# Patient Record
Sex: Male | Born: 1975 | Race: White | Hispanic: No | Marital: Married | Smoking: Never smoker
Health system: Southern US, Community
[De-identification: ages and names within clinical notes are randomized; demographics above are authoritative.]

## PROBLEM LIST (undated history)

## (undated) DIAGNOSIS — K4090 Unilateral inguinal hernia, without obstruction or gangrene, not specified as recurrent: Secondary | ICD-10-CM

## (undated) HISTORY — DX: Unilateral inguinal hernia, without obstruction or gangrene, not specified as recurrent: K40.90

---
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
## 1997-08-06 ENCOUNTER — Encounter: Payer: Self-pay | Admitting: Internal Medicine

## 2003-05-26 HISTORY — PX: INGUINAL HERNIA REPAIR: SHX194

## 2004-02-28 ENCOUNTER — Encounter: Payer: Self-pay | Admitting: Internal Medicine

## 2004-05-20 ENCOUNTER — Ambulatory Visit: Payer: Self-pay | Admitting: Family Medicine

## 2005-04-07 ENCOUNTER — Ambulatory Visit: Payer: Self-pay | Admitting: Internal Medicine

## 2006-08-10 ENCOUNTER — Ambulatory Visit: Payer: Self-pay | Admitting: Internal Medicine

## 2007-09-20 ENCOUNTER — Ambulatory Visit: Payer: Self-pay | Admitting: Internal Medicine

## 2007-09-20 DIAGNOSIS — M949 Disorder of cartilage, unspecified: Secondary | ICD-10-CM

## 2007-09-20 DIAGNOSIS — M899 Disorder of bone, unspecified: Secondary | ICD-10-CM | POA: Insufficient documentation

## 2008-06-27 ENCOUNTER — Ambulatory Visit: Payer: Self-pay | Admitting: Internal Medicine

## 2008-06-27 DIAGNOSIS — J069 Acute upper respiratory infection, unspecified: Secondary | ICD-10-CM | POA: Insufficient documentation

## 2008-07-02 ENCOUNTER — Telehealth: Payer: Self-pay | Admitting: Internal Medicine

## 2008-07-03 ENCOUNTER — Telehealth: Payer: Self-pay | Admitting: Internal Medicine

## 2008-07-03 ENCOUNTER — Ambulatory Visit: Payer: Self-pay | Admitting: Internal Medicine

## 2010-04-02 IMAGING — CR DG CHEST 2V
3 series · 3 of 3 positions shown · non-contrast
Comparison: None

CLINICAL DATA: URI.  Congestion and cough.

CHEST - 2 VIEW

[view not recorded (1 of 3)]
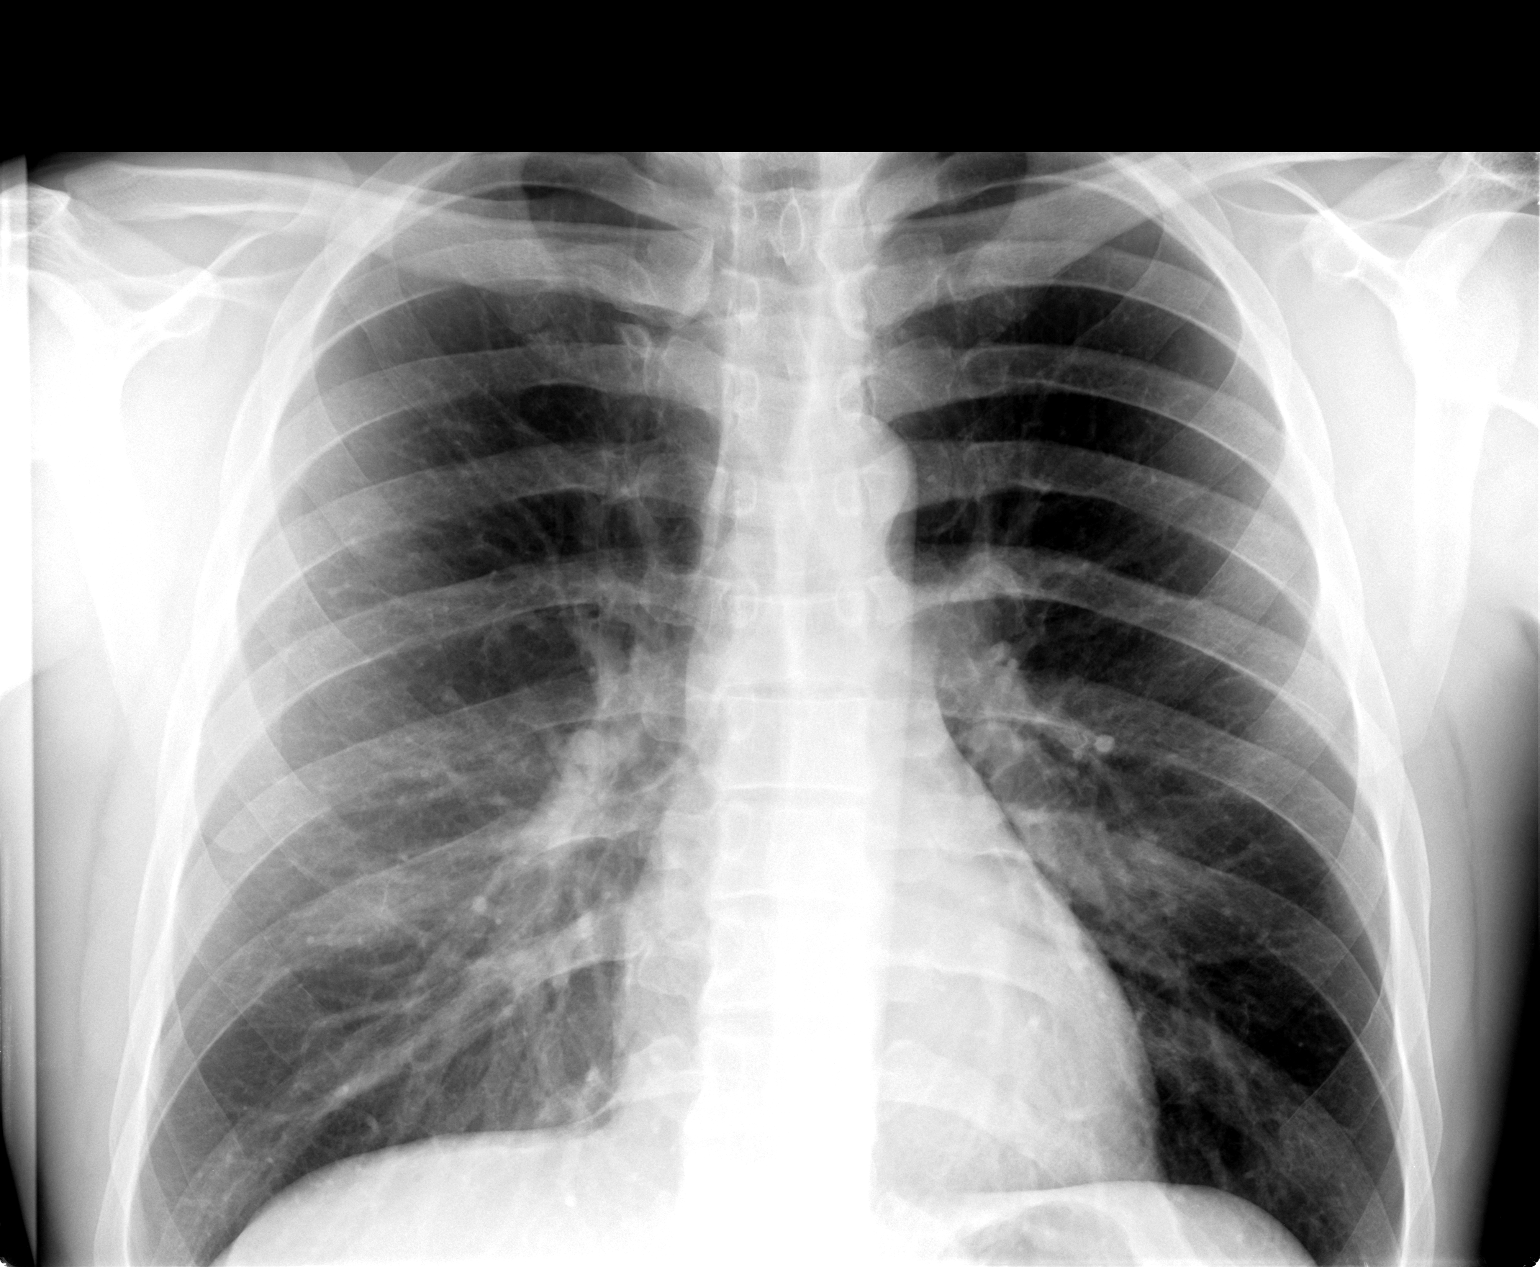

[view not recorded (2 of 3)]
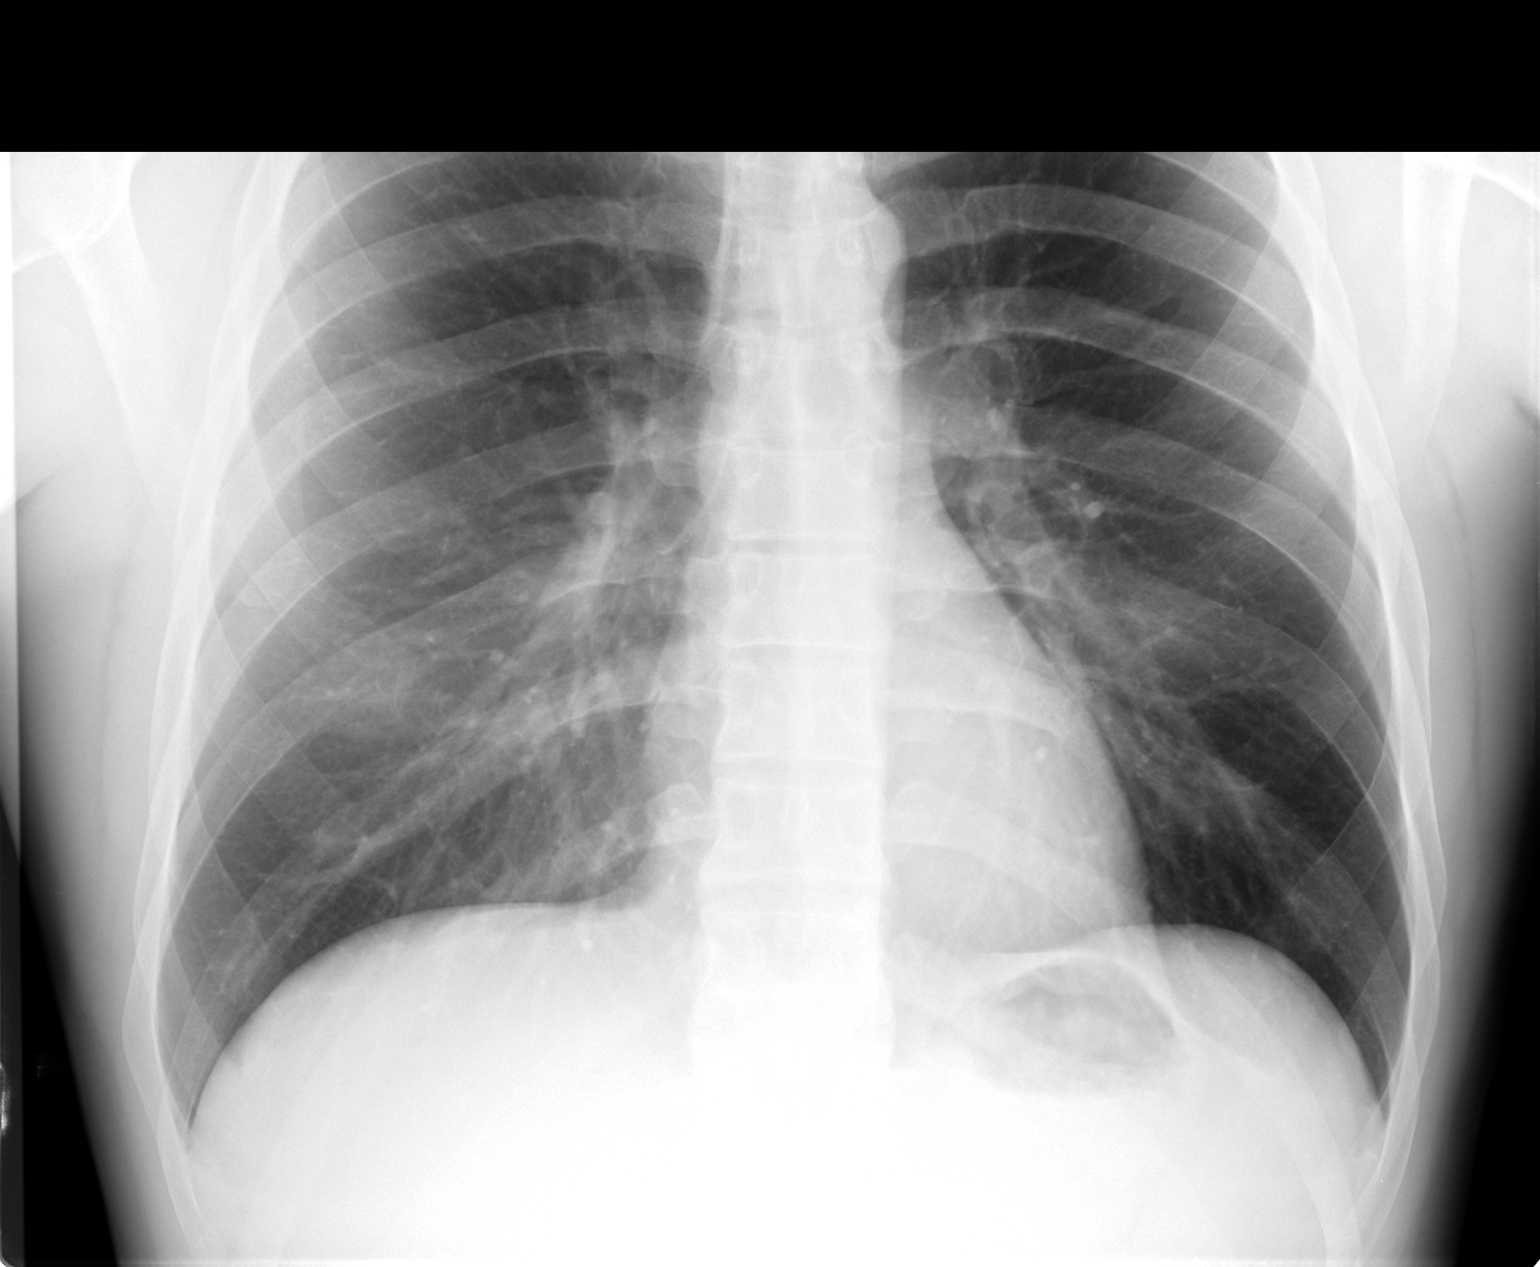

[view not recorded (3 of 3)]
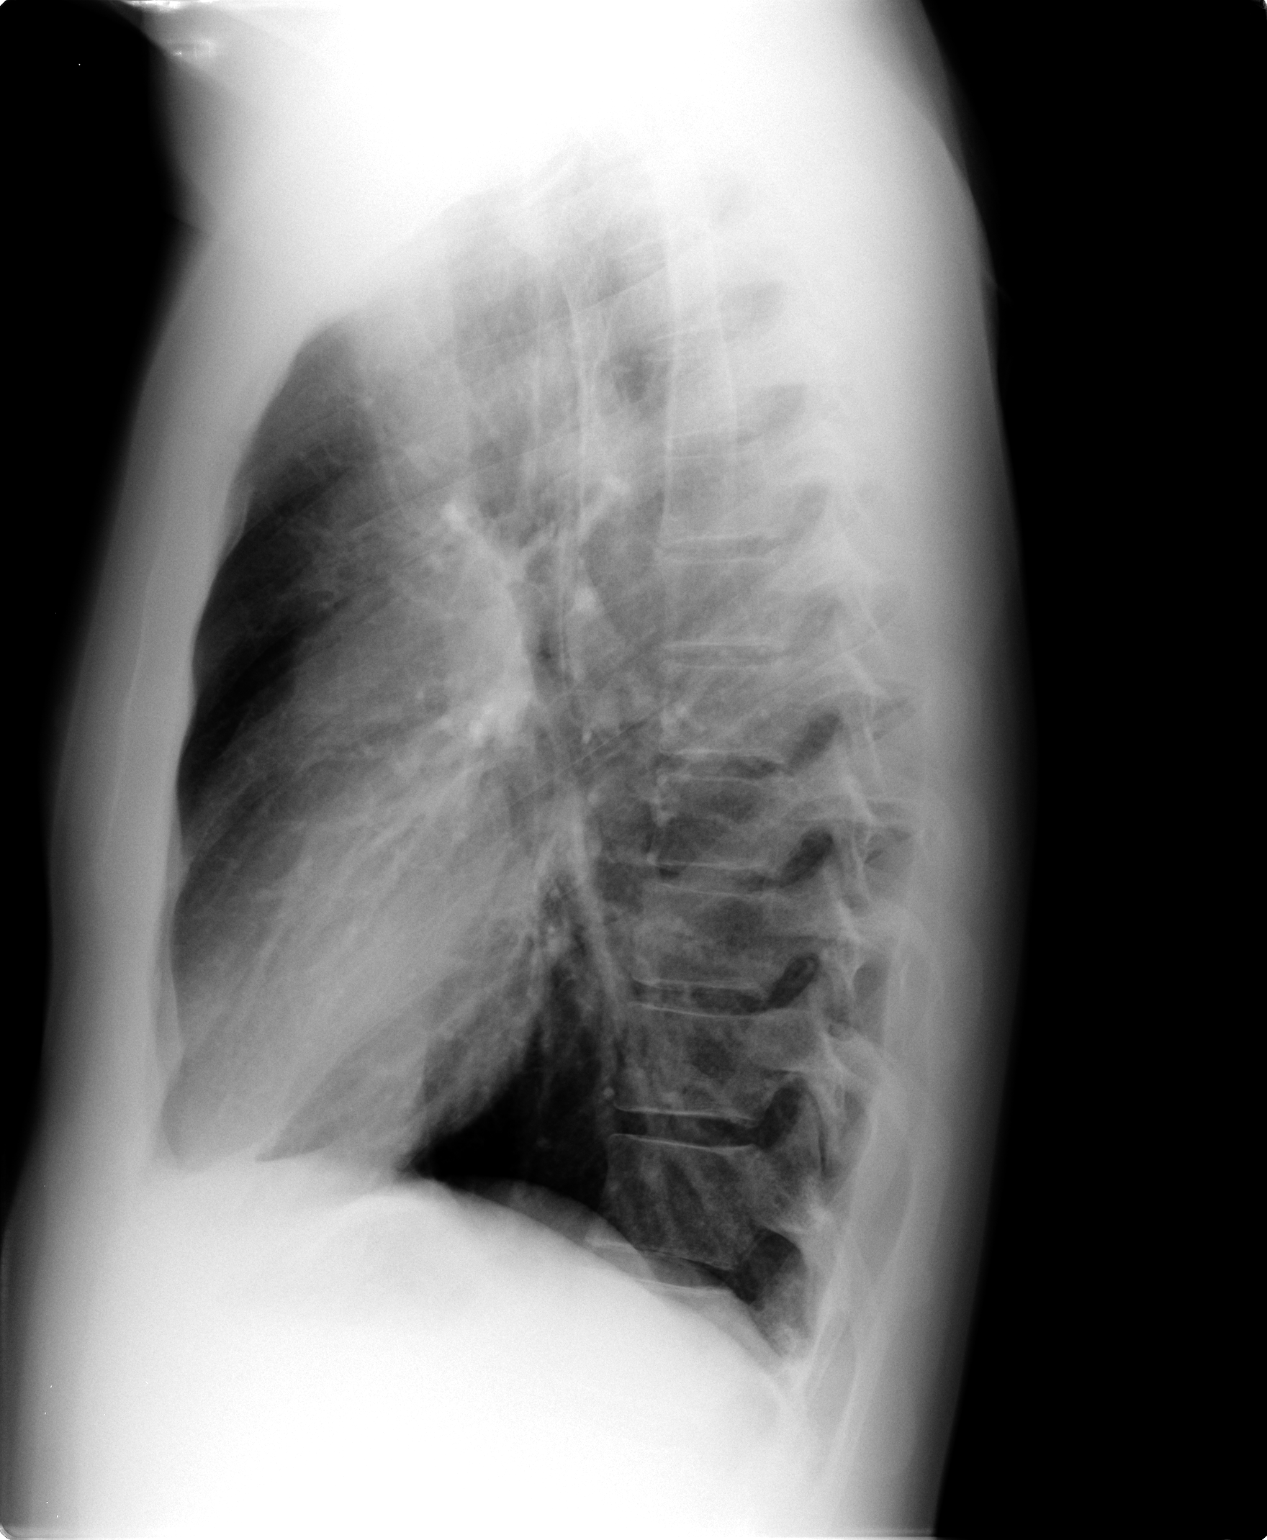

[3 of 3 positions shown; findings below may reference images not displayed]

FINDINGS: The heart size and mediastinal contours are within normal
limits.  Both lungs are clear.  The visualized skeletal structures
are unremarkable.
IMPRESSION: No active cardiopulmonary disease.

## 2010-05-25 HISTORY — PX: WISDOM TOOTH EXTRACTION: SHX21

## 2020-04-06 ENCOUNTER — Ambulatory Visit: Payer: Self-pay | Attending: Internal Medicine

## 2020-04-06 DIAGNOSIS — Z23 Encounter for immunization: Secondary | ICD-10-CM

## 2020-04-06 NOTE — Progress Notes (Signed)
   Covid-19 Vaccination Clinic  Name:  NAZIER NEYHART    MRN: [id number] DOB: 21-Jul-1975  04/06/2020  Mr. Corella was observed post Covid-19 immunization for 15 minutes without incident. He was provided with Vaccine Information Sheet and instruction to access the V-Safe system.   Mr. Creswell was instructed to call 911 with any severe reactions post vaccine: Marland Kitchen Difficulty breathing  . Swelling of face and throat  . A fast heartbeat  . A bad rash all over body  . Dizziness and weakness   Immunizations Administered    Name Date Dose VIS Date Route   Pfizer COVID-19 Vaccine 04/06/2020  2:52 PM 0.3 mL 03/13/2020 Intramuscular   Manufacturer: [employer], [employer]   Lot: [id number]   NDC: [address]-8

## 2021-03-04 ENCOUNTER — Encounter: Payer: Self-pay | Admitting: Internal Medicine

## 2021-04-28 ENCOUNTER — Other Ambulatory Visit: Payer: Self-pay

## 2021-04-28 ENCOUNTER — Ambulatory Visit (AMBULATORY_SURGERY_CENTER): Payer: Self-pay

## 2021-04-28 VITALS — Ht 73.0 in | Wt 187.0 lb

## 2021-04-28 DIAGNOSIS — Z1211 Encounter for screening for malignant neoplasm of colon: Secondary | ICD-10-CM

## 2021-04-28 MED ORDER — PEG 3350-KCL-NA BICARB-NACL 420 G PO SOLR: 4000.0000 mL | Freq: Once | ORAL | 0 refills | Status: AC

## 2021-04-28 NOTE — Progress Notes (Signed)
No egg or soy allergy known to patient  No issues known to pt with past sedation with any surgeries or procedures Patient denies ever being told they had issues or difficulty with intubation  No FH of Malignant Hyperthermia Pt is not on diet pills Pt is not on home 02  Pt is not on blood thinners  Pt denies issues with constipation at this time;  No A fib or A flutter Pt is fully vaccinated for Covid x 2 + booster; NO PA's for preps discussed with pt in [facility] today  Discussed with pt there will be an out-of-pocket cost for prep and that varies from $0 to 70 + dollars - pt verbalized understanding  Due to the COVID-19 pandemic we are asking patients to follow certain guidelines in [facility] and the [facility]   Pt aware of COVID protocols and [facility] guidelines   

## 2021-05-12 ENCOUNTER — Encounter: Payer: Self-pay | Admitting: Internal Medicine

## 2021-06-12 ENCOUNTER — Encounter: Payer: Self-pay | Admitting: Internal Medicine

## 2021-06-18 ENCOUNTER — Encounter: Payer: Self-pay | Admitting: Certified Registered Nurse Anesthetist

## 2021-06-19 ENCOUNTER — Encounter: Payer: Self-pay | Admitting: Internal Medicine

## 2021-06-19 ENCOUNTER — Ambulatory Visit (AMBULATORY_SURGERY_CENTER): Payer: Managed Care, Other (non HMO) | Admitting: Internal Medicine

## 2021-06-19 ENCOUNTER — Other Ambulatory Visit: Payer: Self-pay

## 2021-06-19 VITALS — BP 104/59 | HR 47 | Temp 98.4°F | Resp 13 | Ht 73.0 in | Wt 187.0 lb

## 2021-06-19 DIAGNOSIS — Z1211 Encounter for screening for malignant neoplasm of colon: Secondary | ICD-10-CM | POA: Diagnosis present

## 2021-06-19 MED ORDER — SODIUM CHLORIDE 0.9 % IV SOLN: 500.0000 mL | INTRAVENOUS | Status: DC

## 2021-06-19 NOTE — Patient Instructions (Signed)

## 2021-06-19 NOTE — Op Note (Signed)
[facility] Patient Name: Brandon Nash Procedure Date: 06/19/2021 9:09 AM MRN: [id number] Endoscopist: Beverley Fiedler , MD Age: 46 Referring MD:  Date of Birth: Oct 09, 1975 Gender: Male Account #: [id number] Procedure:                Colonoscopy Indications:              Screening for colorectal malignant neoplasm, This                            is the patient's first colonoscopy Medicines:                Monitored Anesthesia Care Procedure:                Pre-Anesthesia Assessment:                           - Prior to the procedure, a History and Physical                            was performed, and patient medications and                            allergies were reviewed. The patient's tolerance of                            previous anesthesia was also reviewed. The risks                            and benefits of the procedure and the sedation                            options and risks were discussed with the patient.                            All questions were answered, and informed consent                            was obtained. Prior Anticoagulants: The patient has                            taken no previous anticoagulant or antiplatelet                            agents. ASA Grade Assessment: I - A normal, healthy                            patient. After reviewing the risks and benefits,                            the patient was deemed in satisfactory condition to                            undergo the procedure.  After obtaining informed consent, the colonoscope                            was passed under direct vision. Throughout the                            procedure, the patient's blood pressure, pulse, and                            oxygen saturations were monitored continuously. The                            CF [id number] #[id number] was introduced through the anus                            and advanced to the cecum, identified  by                            appendiceal orifice and ileocecal valve. The                            colonoscopy was performed without difficulty. The                            patient tolerated the procedure well. The quality                            of the bowel preparation was excellent. The                            ileocecal valve, appendiceal orifice, and rectum                            were photographed. Scope In: 9:16:38 AM Scope Out: 9:30:36 AM Scope Withdrawal Time: 0 hours 11 minutes 26 seconds  Total Procedure Duration: 0 hours 13 minutes 58 seconds  Findings:                 The digital rectal exam was normal.                           A few small-mouthed diverticula were found in the                            sigmoid colon and descending colon.                           Internal hemorrhoids were found during                            retroflexion. The hemorrhoids were small.                           The exam was otherwise without abnormality. Complications:            No immediate complications.  Estimated Blood Loss:     Estimated blood loss: none. Impression:               - Diverticulosis in the sigmoid colon and in the                            descending colon.                           - Small internal hemorrhoids.                           - The examination was otherwise normal.                           - No specimens collected. Recommendation:           - Patient has a contact number available for                            emergencies. The signs and symptoms of potential                            delayed complications were discussed with the                            patient. Return to normal activities tomorrow.                            Written discharge instructions were provided to the                            patient.                           - Resume previous diet.                           - Continue present medications.                            - Repeat colonoscopy in 10 years for screening                            purposes. Beverley FiedlerJay M Serena Petterson, MD 06/19/2021 9:32:37 AM This report has been signed electronically.

## 2021-06-19 NOTE — Progress Notes (Signed)
Report given to PACU, vss 

## 2021-06-19 NOTE — Progress Notes (Signed)
GASTROENTEROLOGY PROCEDURE H&P NOTE   Primary Care Physician: Shon Baton, MD    Reason for Procedure:  Colorectal cancer screening  Plan:    Colonoscopy  Patient is appropriate for endoscopic procedure(s) in the ambulatory ([facility]) setting.  The nature of the procedure, as well as the risks, benefits, and alternatives were carefully and thoroughly reviewed with the patient. Ample time for discussion and questions allowed. The patient understood, was satisfied, and agreed to proceed.     HPI: Brandon Nash is a 46 y.o. male who presents for screening colonoscopy.  Medical history as below.  Tolerated the prep.  No recent chest pain or shortness of breath.  No abdominal pain today.  Past Medical History:  Diagnosis Date   Right inguinal hernia    surgery to be completed soon per patient    Past Surgical History:  Procedure Laterality Date   INGUINAL HERNIA REPAIR Left 2005   [place] Right 1992   WISDOM TOOTH EXTRACTION  2012    Prior to Admission medications   Medication Sig Start Date End Date Taking? Authorizing Provider  tobramycin-dexamethasone [facility]) ophthalmic solution 1 drop 3 (three) times daily. 06/09/21  Yes [provider]    Current Outpatient Medications  Medication Sig Dispense Refill   tobramycin-dexamethasone (TOBRADEX) ophthalmic solution 1 drop 3 (three) times daily.     Current Facility-Administered Medications  Medication Dose Route Frequency Provider Last Rate Last Admin   0.9 %  sodium chloride infusion  500 mL Intravenous Continuous Renner Sebald, Lajuan Lines, MD        Allergies as of 06/19/2021   (No Known Allergies)    Family History  Problem Relation Age of Onset   Colon polyps Mother 38   Colon polyps Father 68   Colon cancer Neg Hx    Esophageal cancer Neg Hx    Rectal cancer Neg Hx    Stomach cancer Neg Hx     Social History   Socioeconomic History   Marital status: Married    Spouse name: Not on file    Number of children: Not on file   Years of education: Not on file   Highest education level: Not on file  Occupational History   Not on file  Tobacco Use   Smoking status: Never   Smokeless tobacco: Never  Vaping Use   Vaping Use: Never used  Substance and Sexual Activity   Alcohol use: Yes    Alcohol/week: 6.0 standard drinks    Types: 6 Standard drinks or equivalent per week   Drug use: Never   Sexual activity: Not on file  Other Topics Concern   Not on file  Social History Narrative   Not on file   Social Determinants of Health   Financial Resource Strain: Not on file  Food Insecurity: Not on file  Transportation Needs: Not on file  Physical Activity: Not on file  Stress: Not on file  Social Connections: Not on file  Intimate Partner Violence: Not on file    Physical Exam: Vital signs in last 24 hours: @BP  125/64    Pulse 63    Temp 98.4 F (36.9 C)    Ht 6\' 1"  (1.854 m)    Wt 187 lb (84.8 kg)    SpO2 100%    BMI 24.67 kg/m  GEN: NAD EYE: Sclerae anicteric ENT: MMM CV: Non-tachycardic Pulm: CTA b/l GI: Soft, NT/ND NEURO:  Alert & Oriented x 3   Zenovia Jarred, MD [facility]  06/19/2021 9:06 AM

## 2021-06-23 ENCOUNTER — Telehealth: Payer: Self-pay | Admitting: *Deleted

## 2021-06-23 NOTE — Telephone Encounter (Signed)
°  Follow up Call-  Call back number 06/19/2021  Post procedure Call Back phone  # [phone]  Permission to leave phone message Yes  Some recent data might be hidden     Patient questions:  Do you have a fever, pain , or abdominal swelling? No. Pain Score  0 *  Have you tolerated food without any problems? Yes.    Have you been able to return to your normal activities? Yes.    Do you have any questions about your discharge instructions: Diet   No. Medications  No. Follow up visit  No.  Do you have questions or concerns about your Care? No.  Actions: * If pain score is 4 or above: No action needed, pain <4.

## 2022-03-02 ENCOUNTER — Other Ambulatory Visit: Payer: Self-pay | Admitting: Internal Medicine

## 2022-03-02 DIAGNOSIS — Z0000 Encounter for general adult medical examination without abnormal findings: Secondary | ICD-10-CM

## 2022-03-11 ENCOUNTER — Ambulatory Visit
Admission: RE | Admit: 2022-03-11 | Discharge: 2022-03-11 | Disposition: A | Discharge status: Home / Self Care | Payer: Managed Care, Other (non HMO) | Source: Ambulatory Visit | Attending: Internal Medicine | Admitting: Internal Medicine

## 2022-03-11 DIAGNOSIS — Z0000 Encounter for general adult medical examination without abnormal findings: Secondary | ICD-10-CM
